# Patient Record
Sex: Female | Born: 1985 | Race: White | Hispanic: No | Marital: Married | Smoking: Never smoker
Health system: Southern US, Community
[De-identification: ages and names within clinical notes are randomized; demographics above are authoritative.]

## PROBLEM LIST (undated history)

## (undated) DIAGNOSIS — IMO0001 Reserved for inherently not codable concepts without codable children: Secondary | ICD-10-CM

## (undated) DIAGNOSIS — D649 Anemia, unspecified: Secondary | ICD-10-CM

## (undated) DIAGNOSIS — Z975 Presence of (intrauterine) contraceptive device: Secondary | ICD-10-CM

## (undated) HISTORY — PX: NO PAST SURGERIES: SHX2092

## (undated) HISTORY — DX: Reserved for inherently not codable concepts without codable children: IMO0001

---

## 2008-02-12 ENCOUNTER — Ambulatory Visit (HOSPITAL_COMMUNITY): Admission: RE | Admit: 2008-02-12 | Discharge: 2008-02-12 | Payer: Self-pay | Admitting: Obstetrics

## 2008-06-30 ENCOUNTER — Inpatient Hospital Stay (HOSPITAL_COMMUNITY): Admission: AD | Admit: 2008-06-30 | Discharge: 2008-06-30 | Payer: Self-pay | Admitting: Obstetrics & Gynecology

## 2008-07-01 ENCOUNTER — Inpatient Hospital Stay (HOSPITAL_COMMUNITY): Admission: AD | Admit: 2008-07-01 | Discharge: 2008-07-04 | Payer: Self-pay | Admitting: Obstetrics

## 2009-11-28 IMAGING — US US OB DETAIL+14 WK
1 series · 14 of 28 positions shown · non-contrast
Comparison: none

OBSTETRICAL ULTRASOUND:
 This ultrasound exam was performed in the [HOSPITAL] Ultrasound Department.  The OB US report was generated in the AS system, and faxed to the ordering physician.  This report is also available in [REDACTED] PACS.

[Series 1: us ob detail +14 wk · 14 of 95 slices shown]
[im 4/95]
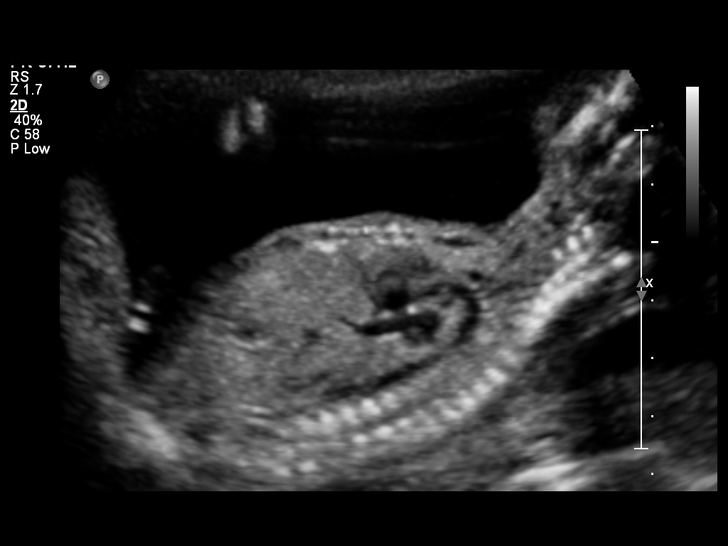
[im 11/95]
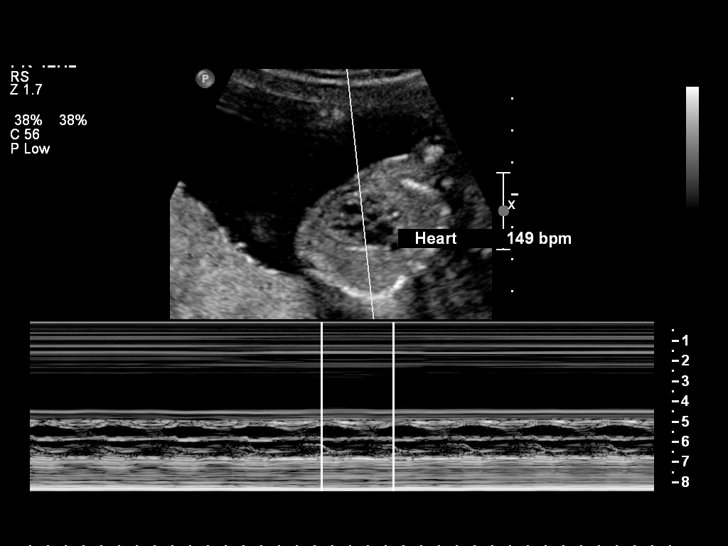
[im 18/95]
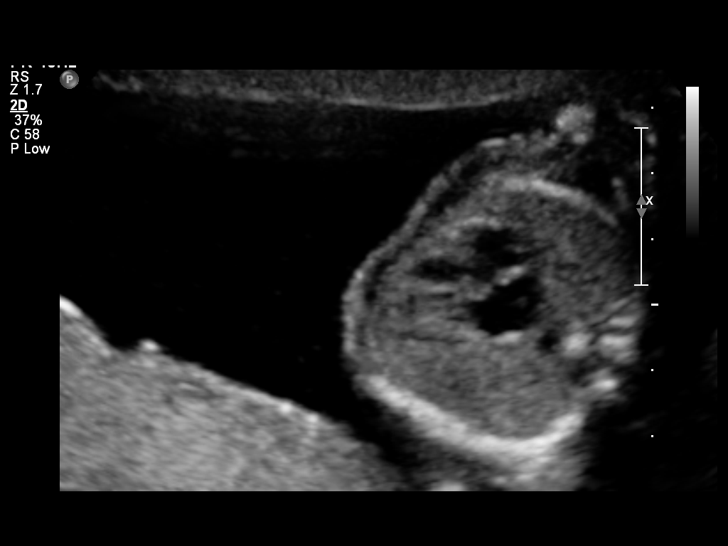
[im 25/95]
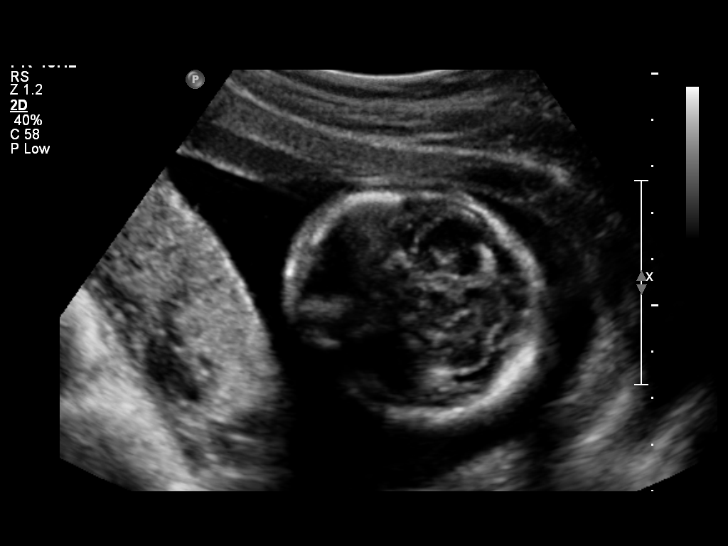
[im 32/95]
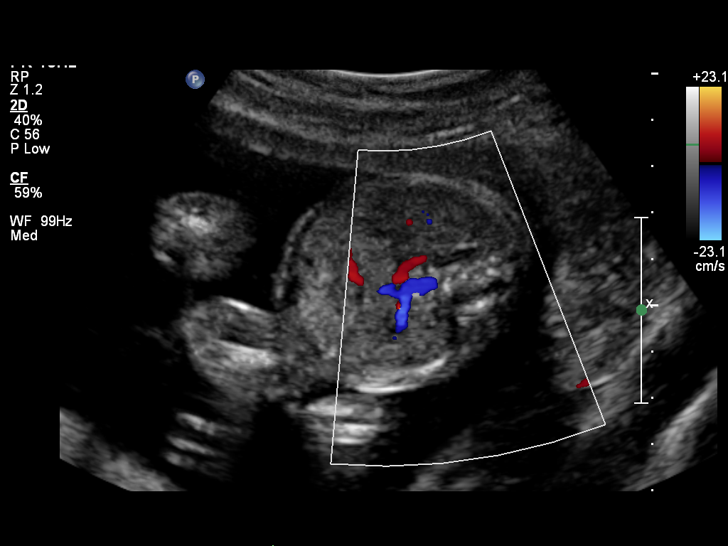
[im 39/95]
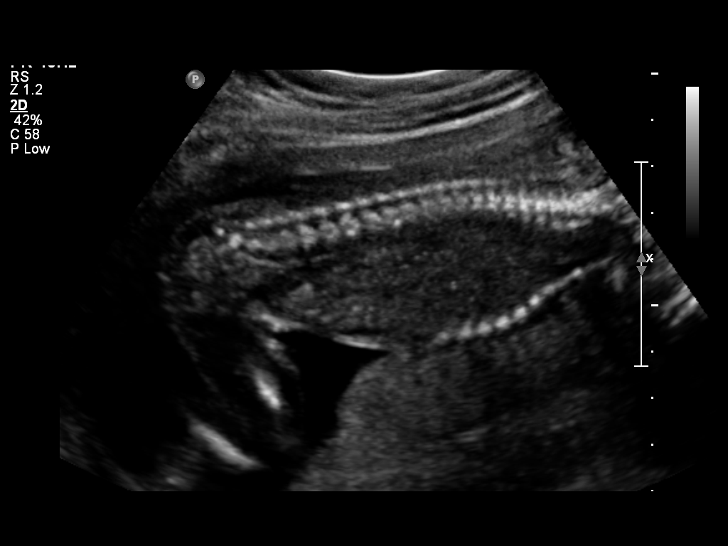
[im 46/95]
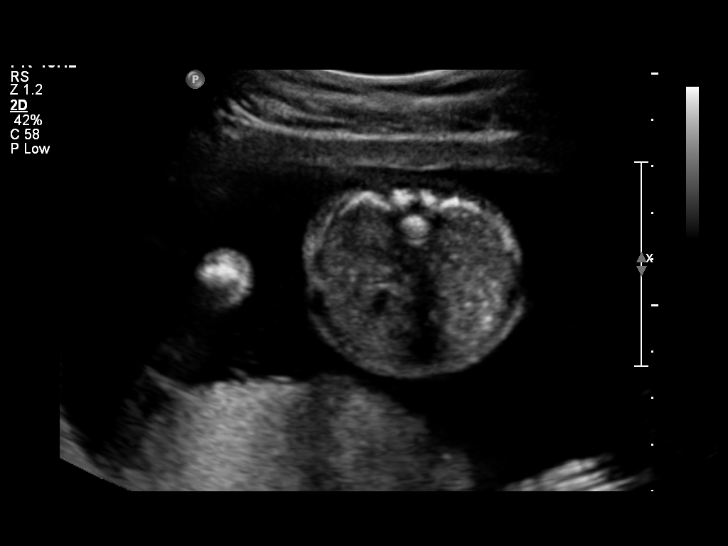
[im 53/95]
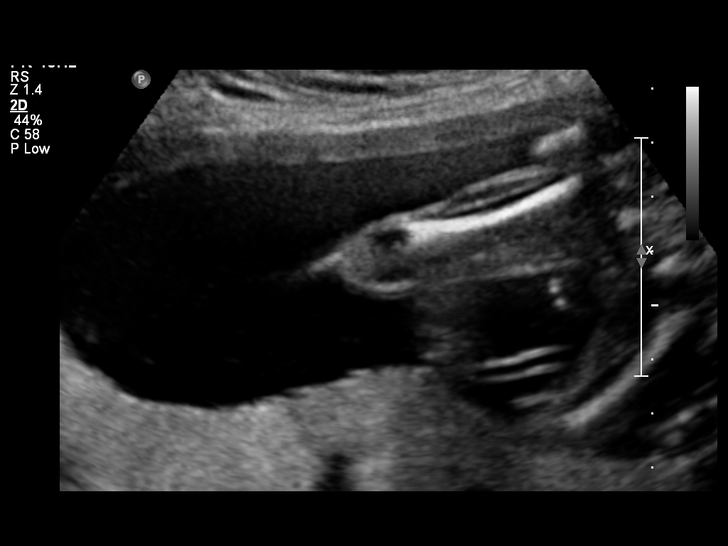
[im 60/95]
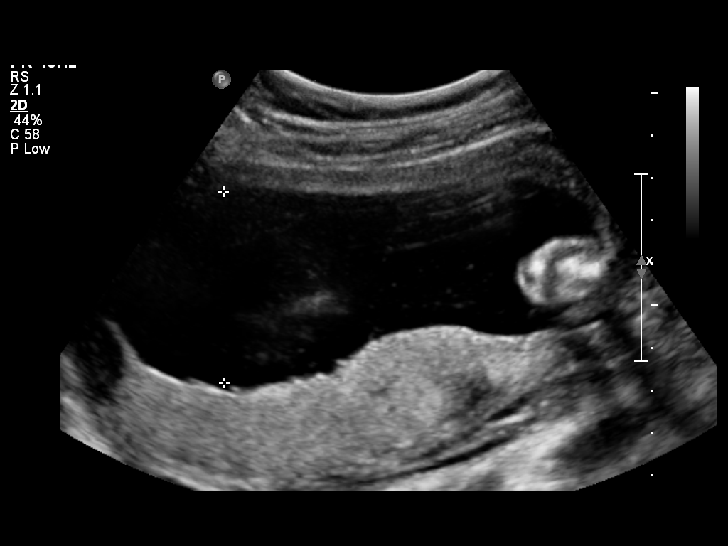
[im 67/95]
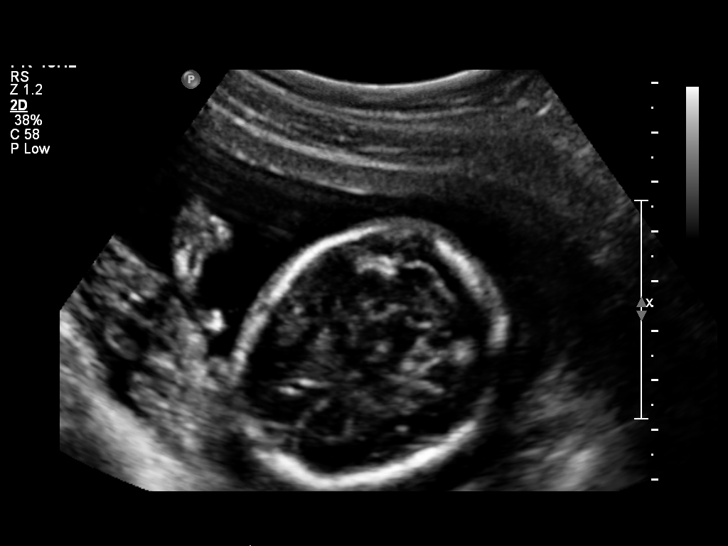
[im 74/95]
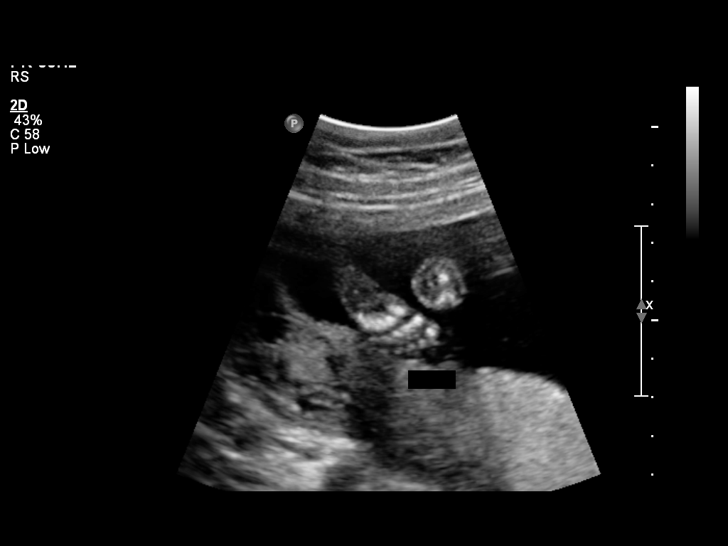
[im 81/95]
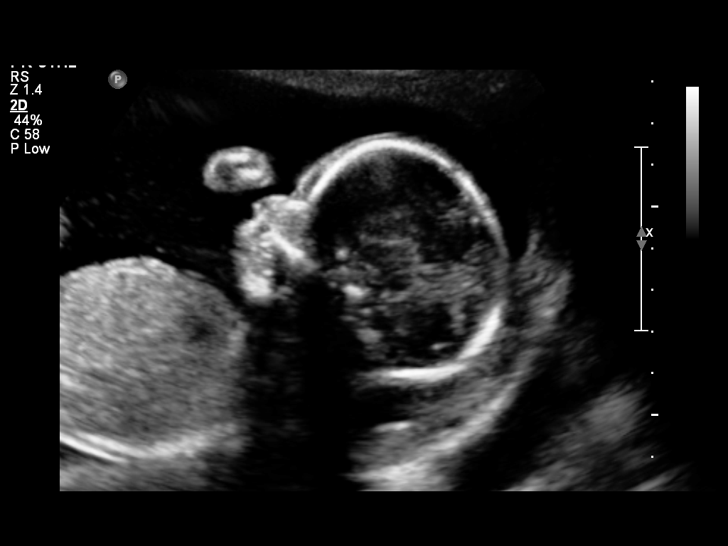
[im 88/95]
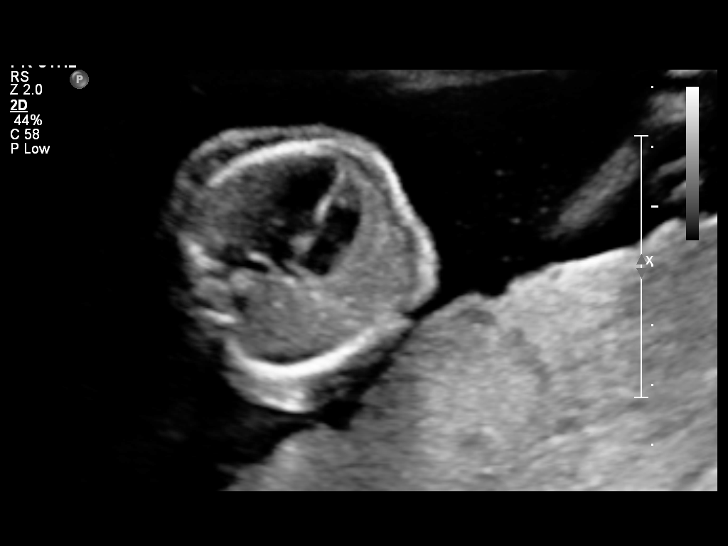
[im 95/95]
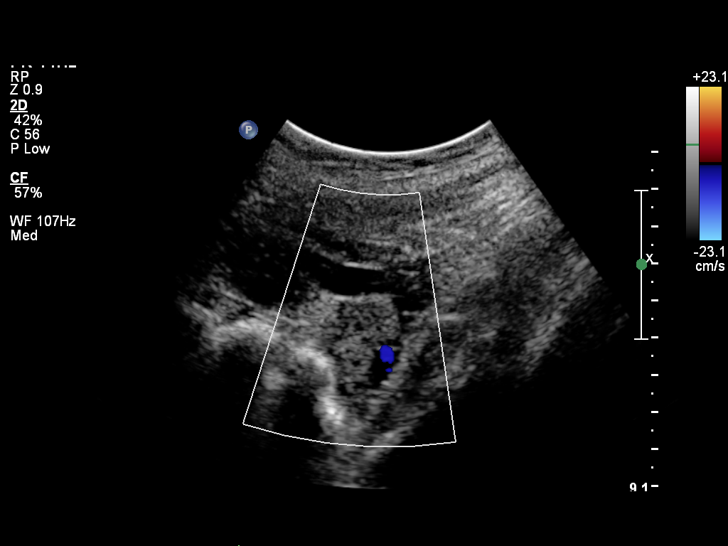

[14 of 28 positions shown; findings below may reference images not displayed]

IMPRESSION: See AS Obstetric US report.

## 2012-11-04 NOTE — L&D Delivery Note (Signed)
  Teresa Castillo, Teresa Castillo [[id number]]  Delivery Note At 5:09 PM a viable and healthy female was delivered via Vaginal, Spontaneous Delivery (Presentation: ; Occiput Anterior).  APGAR: 8, 9; weight 6 lb 1.7 oz (2770 g).   Placenta status: Intact, Spontaneous.  Cord: 3 vessels   The patient quickly progressed in labor and was found to be 9 cm.  She was quickly transported to the OR for delivery.  The Pt was without an epidural and wanted to try to get one before delivery.  In the OR she was sat up and was able to sit still long enough to get an epidural.  After placement she was examined and found to be complete.  She delivered baby A in the vertex OA presentation followed by the body.  The infant was passed to the waiting maternal abdomen.  The cord was clamped and cut and 2 cord clamps were placed on the umbilical cord.    Anesthesia: Epidural  Episiotomy: None Lacerations: None Suture Repair: NA Est. Blood Loss (mL): 500  Mom to postpartum.  Baby to nursery-stable.  Silvia Hightower Rexene Edison 04/01/2013, 9:42 PM     Teresa Castillo, Teresa Castillo [[id number]]  Delivery Note At 5:14 PM a viable and healthy female was delivered via Vaginal, Vacuum (Extractor) (Presentation: ; Occiput Anterior).  APGAR: 8, 9; weight 5 lb 12.6 oz (2625 g).   Placenta status: Intact, Spontaneous.  Cord: 3 vessels  After delivery of baby A, the presenting part of baby B was palpated and confirmed to be vertex.  Amniotomy was performed.  After amniotomy, the fetal heart rate decelerated to 70.  The patient pushed and there was minimal descent of the infants head. The fetal heart tones continued to be in the 70s so the decision was made to assist delivery of baby B with the kiwi vacuum due to persistent bradycardia. The vacuum was applied and the infants head was guided to crowning with one push. Following delivery of the head the vacuum was disengaged and the infants body was delivered and passed to the maternal abdomen.  Baby B cried  vigorously.  The cord was clamped and cut.  Cord blood was collected from the placenta of baby A and B and both placentas were then delivered spontaneously, intact, with 3 vessel cords.  No lacerations required repair.  Mom and babies were doing well after delivery  Anesthesia: Epidural  Episiotomy: None Lacerations: None Suture Repair: NA Est. Blood Loss (mL): 500  Mom to postpartum.  Baby to nursery-stable.  Calie Buttrey H. 04/01/2013, 9:42 PM

## 2013-01-22 LAB — OB RESULTS CONSOLE ABO/RH

## 2013-01-22 LAB — OB RESULTS CONSOLE RUBELLA ANTIBODY, IGM: Rubella: IMMUNE

## 2013-01-22 LAB — OB RESULTS CONSOLE ANTIBODY SCREEN: Antibody Screen: NEGATIVE

## 2013-03-22 LAB — OB RESULTS CONSOLE GBS: GBS: POSITIVE

## 2013-03-22 LAB — OB RESULTS CONSOLE GC/CHLAMYDIA
Chlamydia: NEGATIVE
Gonorrhea: NEGATIVE

## 2013-03-30 ENCOUNTER — Telehealth (HOSPITAL_COMMUNITY): Payer: Self-pay | Admitting: *Deleted

## 2013-03-30 ENCOUNTER — Encounter (HOSPITAL_COMMUNITY): Payer: Self-pay | Admitting: *Deleted

## 2013-03-30 NOTE — Telephone Encounter (Signed)
Preadmission screen  

## 2013-04-01 ENCOUNTER — Encounter (HOSPITAL_COMMUNITY): Payer: Self-pay | Admitting: Anesthesiology

## 2013-04-01 ENCOUNTER — Encounter (HOSPITAL_COMMUNITY): Payer: Self-pay

## 2013-04-01 ENCOUNTER — Inpatient Hospital Stay (HOSPITAL_COMMUNITY): Payer: BC Managed Care – PPO | Admitting: Anesthesiology

## 2013-04-01 ENCOUNTER — Encounter (HOSPITAL_COMMUNITY)
Admission: RE | Disposition: A | Discharge status: Home / Self Care | Payer: Self-pay | Source: Ambulatory Visit | Attending: Obstetrics and Gynecology

## 2013-04-01 ENCOUNTER — Inpatient Hospital Stay (HOSPITAL_COMMUNITY)
Admission: RE | Admit: 2013-04-01 | Discharge: 2013-04-03 | DRG: 373 | Disposition: A | Discharge status: Home / Self Care | Payer: BC Managed Care – PPO | Source: Ambulatory Visit | Attending: Obstetrics and Gynecology | Admitting: Obstetrics and Gynecology

## 2013-04-01 VITALS — BP 131/87 | HR 65 | Temp 98.4°F | Resp 18 | Ht 64.0 in | Wt 188.0 lb

## 2013-04-01 DIAGNOSIS — Z22330 Carrier of Group B streptococcus: Secondary | ICD-10-CM

## 2013-04-01 DIAGNOSIS — O9902 Anemia complicating childbirth: Secondary | ICD-10-CM | POA: Diagnosis present

## 2013-04-01 DIAGNOSIS — Z3480 Encounter for supervision of other normal pregnancy, unspecified trimester: Secondary | ICD-10-CM

## 2013-04-01 DIAGNOSIS — O99892 Other specified diseases and conditions complicating childbirth: Secondary | ICD-10-CM | POA: Diagnosis present

## 2013-04-01 DIAGNOSIS — O30009 Twin pregnancy, unspecified number of placenta and unspecified number of amniotic sacs, unspecified trimester: Principal | ICD-10-CM | POA: Diagnosis present

## 2013-04-01 DIAGNOSIS — D649 Anemia, unspecified: Secondary | ICD-10-CM | POA: Diagnosis present

## 2013-04-01 HISTORY — DX: Anemia, unspecified: D64.9

## 2013-04-01 LAB — CBC
HCT: 27.3 % — ABNORMAL LOW (ref 36.0–46.0)
MCV: 78.9 fL (ref 78.0–100.0)
MCV: 77.9 fL — ABNORMAL LOW (ref 78.0–100.0)
Platelets: 120 10*3/uL — ABNORMAL LOW (ref 150–400)
RBC: 3.53 MIL/uL — ABNORMAL LOW (ref 3.87–5.11)
RDW: 15.3 % (ref 11.5–15.5)
WBC: 5.3 10*3/uL (ref 4.0–10.5)
WBC: 6.1 10*3/uL (ref 4.0–10.5)

## 2013-04-01 LAB — TYPE AND SCREEN

## 2013-04-01 SURGERY — Surgical Case
Anesthesia: Regional

## 2013-04-01 MED ORDER — LACTATED RINGERS IV SOLN: INTRAVENOUS | Status: DC

## 2013-04-01 MED ORDER — DIBUCAINE 1 % RE OINT: 1.0000 "application " | TOPICAL_OINTMENT | RECTAL | Status: DC | PRN

## 2013-04-01 MED ORDER — BUPIVACAINE HCL (PF) 0.25 % IJ SOLN
INTRAMUSCULAR | Status: DC | PRN
  Administered 2013-04-01: 3 mL

## 2013-04-01 MED ORDER — ACETAMINOPHEN 325 MG PO TABS: 650.0000 mg | ORAL_TABLET | ORAL | Status: DC | PRN

## 2013-04-01 MED ORDER — PHENYLEPHRINE 40 MCG/ML (10ML) SYRINGE FOR IV PUSH (FOR BLOOD PRESSURE SUPPORT)
80.0000 ug | PREFILLED_SYRINGE | INTRAVENOUS | Status: DC | PRN
  Filled 2013-04-01: qty 5

## 2013-04-01 MED ORDER — OXYCODONE-ACETAMINOPHEN 5-325 MG PO TABS
1.0000 | ORAL_TABLET | ORAL | Status: DC | PRN
  Administered 2013-04-01 – 2013-04-03 (×4): 1 via ORAL
  Filled 2013-04-01 (×4): qty 1

## 2013-04-01 MED ORDER — OXYCODONE-ACETAMINOPHEN 5-325 MG PO TABS: 1.0000 | ORAL_TABLET | ORAL | Status: DC | PRN

## 2013-04-01 MED ORDER — IBUPROFEN 600 MG PO TABS
600.0000 mg | ORAL_TABLET | Freq: Four times a day (QID) | ORAL | Status: DC
  Administered 2013-04-01 – 2013-04-03 (×8): 600 mg via ORAL
  Filled 2013-04-01 (×8): qty 1

## 2013-04-01 MED ORDER — BENZOCAINE-MENTHOL 20-0.5 % EX AERO: 1.0000 "application " | INHALATION_SPRAY | CUTANEOUS | Status: DC | PRN

## 2013-04-01 MED ORDER — EPHEDRINE 5 MG/ML INJ: 10.0000 mg | INTRAVENOUS | Status: DC | PRN

## 2013-04-01 MED ORDER — LACTATED RINGERS IV SOLN
500.0000 mL | INTRAVENOUS | Status: DC | PRN
  Administered 2013-04-01: 1000 mL via INTRAVENOUS

## 2013-04-01 MED ORDER — BUTORPHANOL TARTRATE 1 MG/ML IJ SOLN: 1.0000 mg | INTRAMUSCULAR | Status: DC | PRN

## 2013-04-01 MED ORDER — OXYTOCIN 40 UNITS IN LACTATED RINGERS INFUSION - SIMPLE MED: 1.0000 m[IU]/min | INTRAVENOUS | Status: DC

## 2013-04-01 MED ORDER — SIMETHICONE 80 MG PO CHEW: 80.0000 mg | CHEWABLE_TABLET | ORAL | Status: DC | PRN

## 2013-04-01 MED ORDER — ZOLPIDEM TARTRATE 5 MG PO TABS: 5.0000 mg | ORAL_TABLET | Freq: Every evening | ORAL | Status: DC | PRN

## 2013-04-01 MED ORDER — OXYTOCIN BOLUS FROM INFUSION: 500.0000 mL | INTRAVENOUS | Status: DC

## 2013-04-01 MED ORDER — ONDANSETRON HCL 4 MG PO TABS: 4.0000 mg | ORAL_TABLET | ORAL | Status: DC | PRN

## 2013-04-01 MED ORDER — IBUPROFEN 600 MG PO TABS: 600.0000 mg | ORAL_TABLET | Freq: Four times a day (QID) | ORAL | Status: DC | PRN

## 2013-04-01 MED ORDER — OXYTOCIN 40 UNITS IN LACTATED RINGERS INFUSION - SIMPLE MED
62.5000 mL/h | INTRAVENOUS | Status: DC
  Administered 2013-04-01: 62.5 mL/h via INTRAVENOUS
  Filled 2013-04-01: qty 1000

## 2013-04-01 MED ORDER — EPHEDRINE 5 MG/ML INJ
10.0000 mg | INTRAVENOUS | Status: DC | PRN
  Filled 2013-04-01: qty 4

## 2013-04-01 MED ORDER — LACTATED RINGERS IV SOLN: 500.0000 mL | Freq: Once | INTRAVENOUS | Status: DC

## 2013-04-01 MED ORDER — SENNOSIDES-DOCUSATE SODIUM 8.6-50 MG PO TABS
2.0000 | ORAL_TABLET | Freq: Every day | ORAL | Status: DC
  Administered 2013-04-01 – 2013-04-02 (×2): 2 via ORAL

## 2013-04-01 MED ORDER — LANOLIN HYDROUS EX OINT: TOPICAL_OINTMENT | CUTANEOUS | Status: DC | PRN

## 2013-04-01 MED ORDER — PRENATAL MULTIVITAMIN CH
1.0000 | ORAL_TABLET | Freq: Every day | ORAL | Status: DC
  Administered 2013-04-02 – 2013-04-03 (×2): 1 via ORAL
  Filled 2013-04-01 (×2): qty 1

## 2013-04-01 MED ORDER — CITRIC ACID-SODIUM CITRATE 334-500 MG/5ML PO SOLN: 30.0000 mL | ORAL | Status: DC | PRN

## 2013-04-01 MED ORDER — TERBUTALINE SULFATE 1 MG/ML IJ SOLN: 0.2500 mg | Freq: Once | INTRAMUSCULAR | Status: DC | PRN

## 2013-04-01 MED ORDER — METHYLERGONOVINE MALEATE 0.2 MG PO TABS: 0.2000 mg | ORAL_TABLET | ORAL | Status: DC | PRN

## 2013-04-01 MED ORDER — FERROUS SULFATE 325 (65 FE) MG PO TABS
325.0000 mg | ORAL_TABLET | Freq: Two times a day (BID) | ORAL | Status: DC
  Administered 2013-04-02 – 2013-04-03 (×3): 325 mg via ORAL
  Filled 2013-04-01 (×3): qty 1

## 2013-04-01 MED ORDER — TETANUS-DIPHTH-ACELL PERTUSSIS 5-2.5-18.5 LF-MCG/0.5 IM SUSP
0.5000 mL | Freq: Once | INTRAMUSCULAR | Status: AC
  Administered 2013-04-02: 0.5 mL via INTRAMUSCULAR
  Filled 2013-04-01: qty 0.5

## 2013-04-01 MED ORDER — PHENYLEPHRINE 40 MCG/ML (10ML) SYRINGE FOR IV PUSH (FOR BLOOD PRESSURE SUPPORT): 80.0000 ug | PREFILLED_SYRINGE | INTRAVENOUS | Status: DC | PRN

## 2013-04-01 MED ORDER — ONDANSETRON HCL 4 MG/2ML IJ SOLN: 4.0000 mg | Freq: Four times a day (QID) | INTRAMUSCULAR | Status: DC | PRN

## 2013-04-01 MED ORDER — PENICILLIN G POTASSIUM 5000000 UNITS IJ SOLR
2.5000 10*6.[IU] | INTRAVENOUS | Status: DC
  Filled 2013-04-01 (×3): qty 2.5

## 2013-04-01 MED ORDER — ONDANSETRON HCL 4 MG/2ML IJ SOLN: 4.0000 mg | INTRAMUSCULAR | Status: DC | PRN

## 2013-04-01 MED ORDER — LIDOCAINE HCL (PF) 1 % IJ SOLN
30.0000 mL | INTRAMUSCULAR | Status: DC | PRN
  Filled 2013-04-01: qty 30

## 2013-04-01 MED ORDER — PENICILLIN G POTASSIUM 5000000 UNITS IJ SOLR
5.0000 10*6.[IU] | Freq: Once | INTRAVENOUS | Status: AC
  Administered 2013-04-01: 5 10*6.[IU] via INTRAVENOUS
  Filled 2013-04-01: qty 5

## 2013-04-01 MED ORDER — DIPHENHYDRAMINE HCL 50 MG/ML IJ SOLN: 12.5000 mg | INTRAMUSCULAR | Status: DC | PRN

## 2013-04-01 MED ORDER — METHYLERGONOVINE MALEATE 0.2 MG/ML IJ SOLN: 0.2000 mg | INTRAMUSCULAR | Status: DC | PRN

## 2013-04-01 MED ORDER — FENTANYL 2.5 MCG/ML BUPIVACAINE 1/10 % EPIDURAL INFUSION (WH - ANES)
14.0000 mL/h | INTRAMUSCULAR | Status: DC | PRN
  Filled 2013-04-01: qty 125

## 2013-04-01 MED ORDER — DIPHENHYDRAMINE HCL 25 MG PO CAPS: 25.0000 mg | ORAL_CAPSULE | Freq: Four times a day (QID) | ORAL | Status: DC | PRN

## 2013-04-01 MED ORDER — WITCH HAZEL-GLYCERIN EX PADS: 1.0000 "application " | MEDICATED_PAD | CUTANEOUS | Status: DC | PRN

## 2013-04-01 SURGICAL SUPPLY — 27 items
CLAMP CORD UMBIL (MISCELLANEOUS) IMPLANT
CLOTH BEACON ORANGE TIMEOUT ST (SAFETY) ×2 IMPLANT
DERMABOND ADVANCED (GAUZE/BANDAGES/DRESSINGS)
DERMABOND ADVANCED .7 DNX12 (GAUZE/BANDAGES/DRESSINGS) IMPLANT
DRAPE LG THREE QUARTER DISP (DRAPES) ×2 IMPLANT
DRSG OPSITE POSTOP 4X10 (GAUZE/BANDAGES/DRESSINGS) ×2 IMPLANT
DURAPREP 26ML APPLICATOR (WOUND CARE) ×2 IMPLANT
ELECT REM PT RETURN 9FT ADLT (ELECTROSURGICAL) ×2
ELECTRODE REM PT RTRN 9FT ADLT (ELECTROSURGICAL) ×1 IMPLANT
EXTRACTOR VACUUM M CUP 4 TUBE (SUCTIONS) IMPLANT
GLOVE BIO SURGEON STRL SZ7 (GLOVE) ×2 IMPLANT
GOWN STRL REIN XL XLG (GOWN DISPOSABLE) ×4 IMPLANT
KIT ABG SYR 3ML LUER SLIP (SYRINGE) IMPLANT
NEEDLE HYPO 25X5/8 SAFETYGLIDE (NEEDLE) IMPLANT
NS IRRIG 1000ML POUR BTL (IV SOLUTION) ×2 IMPLANT
PACK C SECTION WH (CUSTOM PROCEDURE TRAY) ×2 IMPLANT
PAD OB MATERNITY 4.3X12.25 (PERSONAL CARE ITEMS) ×2 IMPLANT
RTRCTR C-SECT PINK 25CM LRG (MISCELLANEOUS) ×2 IMPLANT
STAPLER VISISTAT 35W (STAPLE) IMPLANT
SUT CHROMIC 1 CTX 36 (SUTURE) ×4 IMPLANT
SUT PDS AB 0 CTX 60 (SUTURE) ×2 IMPLANT
SUT VIC AB 2-0 CT1 27 (SUTURE) ×1
SUT VIC AB 2-0 CT1 TAPERPNT 27 (SUTURE) ×1 IMPLANT
SUT VIC AB 4-0 KS 27 (SUTURE) IMPLANT
TOWEL OR 17X24 6PK STRL BLUE (TOWEL DISPOSABLE) ×6 IMPLANT
TRAY FOLEY CATH 14FR (SET/KITS/TRAYS/PACK) ×2 IMPLANT
WATER STERILE IRR 1000ML POUR (IV SOLUTION) ×2 IMPLANT

## 2013-04-01 NOTE — Anesthesia Preprocedure Evaluation (Addendum)

## 2013-04-01 NOTE — H&P (Signed)
Teresa Castillo is a 27 y.o. female presenting for IOL  27 yo [id number] @ 38 weeks presents for an induction of labor for diamniotic/dichorionic twin gestation.  Her pregnancy has been uncomplicated to this point except for anemia.  Her twins have shown concordant growth throughout the pregnancy. History OB History   Grav Para Term Preterm Abortions TAB SAB Ect Mult Living   4 2 2  0 2 2 0 0 1 3     Past Medical History  Diagnosis Date  . Twins   . Anemia     with current pregnancy; was to be taking Iron supp.  Marland Kitchen GERD (gastroesophageal reflux disease)     with current pregnancy; Tums helped   Past Surgical History  Procedure Laterality Date  . No past surgeries     Family History: family history includes COPD in her maternal grandfather; Diabetes in her father and paternal grandfather; Hearing loss in her mother; Hypertension in her maternal grandmother; Learning disabilities in her mother; and Miscarriages / Stillbirths in her mother.  There is no history of Alcohol abuse, and Arthritis, and Asthma, and Birth defects, and Cancer, and Depression, and Drug abuse, and Early death, and Heart disease, and Hyperlipidemia, and Kidney disease, and Mental illness, and Mental retardation, and Vision loss, and Stroke, . Social History:  reports that she has never smoked. She has never used smokeless tobacco. She reports that she does not drink alcohol or use illicit drugs.   Prenatal Transfer Tool  Maternal Diabetes: No Genetic Screening: Declined Maternal Ultrasounds/Referrals: Normal Fetal Ultrasounds or other Referrals:  None Maternal Substance Abuse:  No Significant Maternal Medications:  None Significant Maternal Lab Results:  Lab values include: Other:  Hgb 8.3 Other Comments:  None  ROS : as above   Dilation: 10 Effacement (%): 100 Station: +2 Exam by:: Dr. Tenny Craw Blood pressure 127/80, pulse 101, temperature 98.3 F (36.8 C), temperature source Oral, resp. rate 18, height 5\' 4"   (1.626 m), weight 85.276 kg (188 lb), last menstrual period 07/09/2012, SpO2 100.00%, unknown if currently breastfeeding. Exam Physical Exam  Prenatal labs: ABO, Rh: --/--/O POS, O POS (05/29 0825) Antibody: NEG (05/29 0825) Rubella: Immune (03/21 0000) RPR: NON REACTIVE (05/29 0825)  HBsAg: Negative (03/21 0000)  HIV: Non-reactive (03/21 0000)  GBS: Positive (05/19 0000)   Assessment/Plan: 1) Admit 2) PCN for GBS 3) Epidural on request 4) AROM pit   Teresa Castillo H. 04/01/2013, 10:00 PM

## 2013-04-01 NOTE — Anesthesia Procedure Notes (Signed)
Epidural Patient location during procedure: OB  Preanesthetic Checklist Completed: patient identified, site marked, surgical consent, pre-op evaluation, timeout performed, IV checked, risks and benefits discussed and monitors and equipment checked  Epidural Patient position: sitting Prep: site prepped and draped and DuraPrep Patient monitoring: continuous pulse ox and blood pressure Approach: midline Injection technique: LOR air  Needle:  Needle type: Tuohy  Needle gauge: 17 G Needle length: 9 cm and 9 Needle insertion depth: 5 cm cm Catheter type: closed end flexible Catheter size: 19 Gauge Catheter at skin depth: 10 cm Test dose: negative  Assessment Events: blood not aspirated, injection not painful, no injection resistance, negative IV test and no paresthesia  Additional Notes Spinal Dosage in OR (-) asp CSF  Bupivicaine 3 ml .25% through sprotte 24GA   Pt comfortable

## 2013-04-02 ENCOUNTER — Encounter (HOSPITAL_COMMUNITY): Payer: Self-pay | Admitting: Obstetrics and Gynecology

## 2013-04-02 LAB — CBC
MCHC: 31 g/dL (ref 30.0–36.0)
RDW: 15.4 % (ref 11.5–15.5)
WBC: 8.9 10*3/uL (ref 4.0–10.5)

## 2013-04-02 NOTE — Progress Notes (Signed)
PPD#1 Pt is doing well. Lochia One baby with hypospadius. IMP/ Doing well PLAN/ routine care.

## 2013-04-03 MED ORDER — FERROUS SULFATE 325 (65 FE) MG PO TABS: 325.0000 mg | ORAL_TABLET | Freq: Two times a day (BID) | ORAL | Status: DC

## 2013-04-03 MED ORDER — IBUPROFEN 600 MG PO TABS: 600.0000 mg | ORAL_TABLET | Freq: Four times a day (QID) | ORAL | Status: DC

## 2013-04-03 MED ORDER — OXYCODONE-ACETAMINOPHEN 5-325 MG PO TABS
1.0000 | ORAL_TABLET | ORAL | Status: DC | PRN
Start: 2013-04-03 — End: 2013-07-04

## 2013-04-03 NOTE — Progress Notes (Signed)
PPD#2 Pt doing well. Will discharge to home.

## 2013-04-03 NOTE — Discharge Summary (Signed)
Obstetric Discharge Summary Reason for Admission: induction of labor and twin pregnancy Prenatal Procedures: ultrasound Intrapartum Procedures: spontaneous vaginal delivery and vacuum Postpartum Procedures: none Complications-Operative and Postpartum: none Hemoglobin  Date Value Range Status  04/02/2013 7.2* 12.0 - 15.0 g/dL Final     HCT  Date Value Range Status  04/02/2013 23.2* 36.0 - 46.0 % Final    Physical Exam:  General: alert Lochia: appropriate Uterine Fundus: firm   Discharge Diagnoses: Term Pregnancy-delivered and twin pregnancy  Discharge Information: Date: 04/03/2013 Activity: pelvic rest Diet: routine Medications: PNV, Ibuprofen, Iron and Percocet Condition: stable Instructions: refer to practice specific booklet Discharge to: home Follow-up Information   Follow up with Almon Hercules., MD. Schedule an appointment as soon as possible for a visit in 4 weeks.   Contact information:   [address] [place] [address] [place] [address] [phone]       Newborn Data:   Jacqualyn, Sedgwick [[id number]]  Live born female  Birth Weight: 6 lb 1.7 oz (2770 g) APGAR: 8, 9   Grizel, Vesely [[id number]]  Live born female  Birth Weight: 5 lb 12.6 oz (2625 g) APGAR: 8, 9  Home with mother.  ANDERSON,MARK E 04/03/2013, 7:41 AM

## 2013-04-15 ENCOUNTER — Inpatient Hospital Stay (HOSPITAL_COMMUNITY): Admission: AD | Admit: 2013-04-15 | Payer: Self-pay | Source: Ambulatory Visit | Admitting: Obstetrics and Gynecology

## 2013-06-04 DIAGNOSIS — Z975 Presence of (intrauterine) contraceptive device: Secondary | ICD-10-CM

## 2013-06-04 HISTORY — DX: Presence of (intrauterine) contraceptive device: Z97.5

## 2013-07-04 ENCOUNTER — Ambulatory Visit (INDEPENDENT_AMBULATORY_CARE_PROVIDER_SITE_OTHER): Payer: BC Managed Care – PPO | Admitting: Family Medicine

## 2013-07-04 VITALS — BP 120/80 | HR 86 | Temp 99.5°F | Resp 16 | Ht 64.5 in | Wt 144.0 lb

## 2013-07-04 DIAGNOSIS — J029 Acute pharyngitis, unspecified: Secondary | ICD-10-CM

## 2013-07-04 DIAGNOSIS — J020 Streptococcal pharyngitis: Secondary | ICD-10-CM

## 2013-07-04 LAB — POCT RAPID STREP A (OFFICE): Rapid Strep A Screen: POSITIVE — AB

## 2013-07-04 MED ORDER — AMOXICILLIN 875 MG PO TABS: 875.0000 mg | ORAL_TABLET | Freq: Two times a day (BID) | ORAL | Status: DC

## 2013-07-04 NOTE — Progress Notes (Signed)
[facility]:  Office Visit  Chief Complaint:  Chief Complaint  Patient presents with  . Sore Throat    x 2 day    HPI: Teresa Castillo is a 27 y.o. female who complains of 2 day history of sore thorat, she has a 26 year old in kindergarten and 52 month old tiwns. She denies fevers or chills. + PND, no allergies. No ear or facial pain  She is breast feeding. She has never had strep throat. + "stuff in back of throat"  Past Medical History  Diagnosis Date  . Twins   . Anemia     with current pregnancy; was to be taking Iron supp.  Marland Kitchen GERD (gastroesophageal reflux disease)     with current pregnancy; Tums helped   Past Surgical History  Procedure Laterality Date  . No past surgeries    . Cesarean section N/A 04/01/2013    Procedure: Vaginal delivery of Twins;  Surgeon: Freddrick March. Tenny Craw, MD;  Location: [facility];  Service: Obstetrics;  Laterality: N/A;   History   Social History  . Marital Status: Married    Spouse Name: N/A    Number of Children: N/A  . Years of Education: N/A   Social History Main Topics  . Smoking status: Never Smoker   . Smokeless tobacco: Never Used  . Alcohol Use: No  . Drug Use: No  . Sexual Activity: Yes     Comment: TUBAL IF cs   Other Topics Concern  . None   Social History Narrative  . None   Family History  Problem Relation Age of Onset  . Hearing loss Mother     left ear deaf  . Learning disabilities Mother     ADHD  . Miscarriages / [place] Mother   . Diabetes Father   . Hypertension Maternal Grandmother   . COPD Maternal Grandfather   . Diabetes Paternal Grandfather   . Alcohol abuse Neg Hx   . Arthritis Neg Hx   . Asthma Neg Hx   . Birth defects Neg Hx   . Cancer Neg Hx   . Depression Neg Hx   . Drug abuse Neg Hx   . Early death Neg Hx   . Heart disease Neg Hx   . Hyperlipidemia Neg Hx   . Kidney disease Neg Hx   . Mental illness Neg Hx   . Mental retardation Neg Hx   . Vision loss Neg Hx   . Stroke  Neg Hx    Allergies  Allergen Reactions  . Sulfa Antibiotics     Unknown reaction, childhood   Prior to Admission medications   Medication Sig Start Date End Date Taking? Authorizing Provider  ibuprofen (ADVIL,MOTRIN) 600 MG tablet Take 1 tablet (600 mg total) by mouth every 6 (six) hours. 04/03/13  Yes Levi Aland, MD  ferrous sulfate 325 (65 FE) MG tablet Take 1 tablet (325 mg total) by mouth 2 (two) times daily with a meal. 04/03/13   Levi Aland, MD  oxyCODONE-acetaminophen (PERCOCET/ROXICET) 5-325 MG per tablet Take 1-2 tablets by mouth every 4 (four) hours as needed. 04/03/13   Levi Aland, MD  Prenatal Vit-Fe Fumarate-FA (PRENATAL MULTIVITAMIN) TABS Take 1 tablet by mouth daily at 12 noon.    Historical Provider, MD     ROS: The patient denies fevers, chills, night sweats, unintentional weight loss, chest pain, palpitations, wheezing, dyspnea on exertion, nausea, vomiting, abdominal pain, dysuria, hematuria, melena, numbness, weakness, or  tingling.   All other systems have been reviewed and were otherwise negative with the exception of those mentioned in the HPI and as above.    PHYSICAL EXAM: Filed Vitals:   07/04/13 1545  BP: 120/80  Pulse: 86  Temp: 99.5 F (37.5 C)  Resp: 16   Filed Vitals:   07/04/13 1545  Height: 5' 4.5" (1.638 m)  Weight: 144 lb (65.318 kg)   Body mass index is 24.34 kg/(m^2).  General: Alert, no acute distress HEENT:  Normocephalic, atraumatic, oropharynx patent. EOMI, PERRLA, + exudates. Tm nl. Tonsil erythematous but nl  Cardiovascular:  Regular rate and rhythm, no rubs murmurs or gallops.  No Carotid bruits, radial pulse intact. No pedal edema.  Respiratory: Clear to auscultation bilaterally.  No wheezes, rales, or rhonchi.  No cyanosis, no use of accessory musculature GI: No organomegaly, abdomen is soft and non-tender, positive bowel sounds.  No masses. Skin: No rashes. Neurologic: Facial musculature symmetric. Psychiatric:  Patient is appropriate throughout our interaction. Lymphatic: No cervical lymphadenopathy Musculoskeletal: Gait intact.   LABS: Results for orders placed in visit on 07/04/13  POCT RAPID STREP A (OFFICE)      Result Value Range   Rapid Strep A Screen Positive (*) Negative     EKG/XRAY:   Primary read interpreted by Dr. Conley Rolls at [facility].   ASSESSMENT/PLAN: Encounter Diagnoses  Name Primary?  . Sore throat Yes  . Strep pharyngitis    Rx amoxacillin 875 mg Work note given for 3 days, return to work Wednesday night Advise to let pediatrician know since she has young children Gross sideeffects, risk and benefits, and alternatives of medications d/w patient. Patient is aware that all medications have potential sideeffects and we are unable to predict every sideeffect or drug-drug interaction that may occur.  Hoby Kawai PHUONG, DO 07/04/2013 4:10 PM

## 2013-07-04 NOTE — Patient Instructions (Addendum)

## 2013-11-04 HISTORY — PX: LEEP: SHX91

## 2013-12-19 ENCOUNTER — Inpatient Hospital Stay (HOSPITAL_COMMUNITY): Payer: BC Managed Care – PPO

## 2013-12-19 ENCOUNTER — Inpatient Hospital Stay (HOSPITAL_COMMUNITY)
Admission: AD | Admit: 2013-12-19 | Discharge: 2013-12-19 | Disposition: A | Discharge status: Home / Self Care | Payer: BC Managed Care – PPO | Source: Ambulatory Visit | Attending: Obstetrics and Gynecology | Admitting: Obstetrics and Gynecology

## 2013-12-19 ENCOUNTER — Encounter (HOSPITAL_COMMUNITY): Payer: Self-pay

## 2013-12-19 DIAGNOSIS — N939 Abnormal uterine and vaginal bleeding, unspecified: Secondary | ICD-10-CM

## 2013-12-19 DIAGNOSIS — N898 Other specified noninflammatory disorders of vagina: Secondary | ICD-10-CM | POA: Insufficient documentation

## 2013-12-19 DIAGNOSIS — Z30431 Encounter for routine checking of intrauterine contraceptive device: Secondary | ICD-10-CM | POA: Insufficient documentation

## 2013-12-19 HISTORY — DX: Presence of (intrauterine) contraceptive device: Z97.5

## 2013-12-19 LAB — URINE MICROSCOPIC-ADD ON

## 2013-12-19 LAB — URINALYSIS, ROUTINE W REFLEX MICROSCOPIC
BILIRUBIN URINE: NEGATIVE
GLUCOSE, UA: NEGATIVE mg/dL
Ketones, ur: 15 mg/dL — AB
Leukocytes, UA: NEGATIVE
Nitrite: NEGATIVE
PH: 6.5 (ref 5.0–8.0)
Protein, ur: NEGATIVE mg/dL
SPECIFIC GRAVITY, URINE: 1.02 (ref 1.005–1.030)
Urobilinogen, UA: 0.2 mg/dL (ref 0.0–1.0)

## 2013-12-19 LAB — POCT PREGNANCY, URINE: PREG TEST UR: NEGATIVE

## 2013-12-19 MED ORDER — ESTRADIOL 0.5 MG PO TABS: 0.5000 mg | ORAL_TABLET | Freq: Every day | ORAL | Status: DC

## 2013-12-19 MED ORDER — FERRIC SUBSULFATE SOLN
Freq: Once | Status: DC
  Filled 2013-12-19: qty 100

## 2013-12-19 NOTE — MAU Note (Signed)
Had LEEP procedure 3 weeks ago, was spotting then yesterday at 6:30 pm, bleeding increased and was using super tampon, passed a large blood clot at 4:30 am, came here and tampon was full from just one hour.

## 2013-12-19 NOTE — MAU Provider Note (Signed)
History     CSN: [id number]  Arrival date and time: 12/19/13 4098   None     Chief Complaint  Patient presents with  . Vaginal Bleeding   Vaginal Bleeding Pertinent negatives include no abdominal pain, fever, nausea or vomiting.   This is a 28 y.o. female who presents with c/o bleeding since yesterday. It increased in the eventing necessitating a super tampon.  She passed a large clot this morning.  Denies pain. Has had IUD since August, and has had spotting off and on since then.   RN note: Had LEEP procedure 3 weeks ago, was spotting then yesterday at 6:30 pm, bleeding increased and was using super tampon, passed a large blood clot at 4:30 am, came here and tampon was full from just one hour.       OB History   Grav Para Term Preterm Abortions TAB SAB Ect Mult Living   4 2 2  0 2 2 0 0 1 3      Past Medical History  Diagnosis Date  . Twins   . Anemia     with current pregnancy; was to be taking Iron supp.  . IUD (intrauterine device) in place 06/2013    Mirena    Past Surgical History  Procedure Laterality Date  . No past surgeries    . Cesarean section N/A 04/01/2013    Procedure: Vaginal delivery of Twins;  Surgeon: Freddrick March. Tenny Craw, MD;  Location: [facility];  Service: Obstetrics;  Laterality: N/A;  . Leep  11/2013    Family History  Problem Relation Age of Onset  . Hearing loss Mother     left ear deaf  . Learning disabilities Mother     ADHD  . Miscarriages / [place] Mother   . Diabetes Father   . Hypertension Maternal Grandmother   . COPD Maternal Grandfather   . Diabetes Paternal Grandfather   . Alcohol abuse Neg Hx   . Arthritis Neg Hx   . Asthma Neg Hx   . Birth defects Neg Hx   . Cancer Neg Hx   . Depression Neg Hx   . Drug abuse Neg Hx   . Early death Neg Hx   . Heart disease Neg Hx   . Hyperlipidemia Neg Hx   . Kidney disease Neg Hx   . Mental illness Neg Hx   . Mental retardation Neg Hx   . Vision loss Neg Hx   . Stroke Neg Hx      History  Substance Use Topics  . Smoking status: Never Smoker   . Smokeless tobacco: Never Used  . Alcohol Use: Yes     Comment: occas use    Allergies:  Allergies  Allergen Reactions  . Sulfa Antibiotics     Unknown reaction, childhood    Prescriptions prior to admission  Medication Sig Dispense Refill  . amoxicillin (AMOXIL) 875 MG tablet Take 1 tablet (875 mg total) by mouth 2 (two) times daily.  20 tablet  0  . ibuprofen (ADVIL,MOTRIN) 600 MG tablet Take 1 tablet (600 mg total) by mouth every 6 (six) hours.  30 tablet  0  . Prenatal Vit-Fe Fumarate-FA (PRENATAL MULTIVITAMIN) TABS Take 1 tablet by mouth daily at 12 noon.        Review of Systems  Constitutional: Negative for fever and malaise/fatigue.  Gastrointestinal: Negative for nausea, vomiting and abdominal pain.  Genitourinary: Positive for vaginal bleeding.       Vaginal bleeding  Neurological: Negative for  weakness.   Physical Exam   Blood pressure 103/63, pulse 76, temperature 98.4 F (36.9 C), temperature source Oral, resp. rate 18.  Physical Exam  Constitutional: She is oriented to person, place, and time. She appears well-developed and well-nourished. No distress.  HENT:  Head: Normocephalic.  Cardiovascular: Normal rate.   Respiratory: Effort normal.  GI: Soft. She exhibits no distension. There is no tenderness. There is no rebound and no guarding.  Genitourinary: Uterus normal. Vaginal discharge (quarter sized clot at os, removed with swab, no active bleeding. Cervix slightly friable) found.  Musculoskeletal: Normal range of motion.  Neurological: She is alert and oriented to person, place, and time.  Skin: Skin is warm and dry.  Psychiatric: She has a normal mood and affect.    MAU Course  Procedures  MDM Discussed with Dr Henderson CloudHorvath.  Will check US to measure endometrial thickness. If thickened will treat with estradiol.  Will also apply Monsels to cervix to alleviate friability  US showed  IUD in proper place Endometrium 10mm Ovaries normal  Monsels applied to cervix  Assessment and Plan  A:  Vaginal bleeding probably due to cervical friability  P:  DIscussed with Dr Henderson CloudHorvath       Discharge home       May use ibuprofen PRN      Followup in office                       [facility] Francis[facility]ILLIAMS,Cartha Rotert 12/19/2013, 6:19 AM

## 2013-12-19 NOTE — Discharge Instructions (Signed)

## 2014-09-05 ENCOUNTER — Encounter (HOSPITAL_COMMUNITY): Payer: Self-pay

## 2015-10-05 IMAGING — US US PELVIS COMPLETE
1 series · 14 of 25 positions shown · non-contrast
Comparison: None

CLINICAL DATA: IUD with bleeding.  Evaluate endometrial thickness.

EXAM:
TRANSABDOMINAL AND TRANSVAGINAL ULTRASOUND OF PELVIS
TECHNIQUE: Both transabdominal and transvaginal ultrasound examinations of the
pelvis were performed. Transabdominal technique was performed for
global imaging of the pelvis including uterus, ovaries, adnexal
regions, and pelvic cul-de-sac. It was necessary to proceed with
endovaginal exam following the transabdominal exam to visualize the
endometrium and ovaries.

[Series 1: us pelvis complete · 44 acquisitions, 14 frames shown]
[im 1/44]
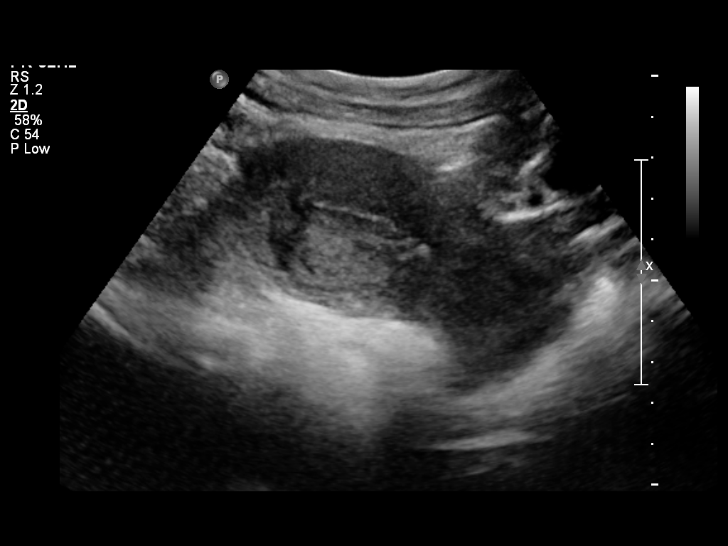
[im 4/44]
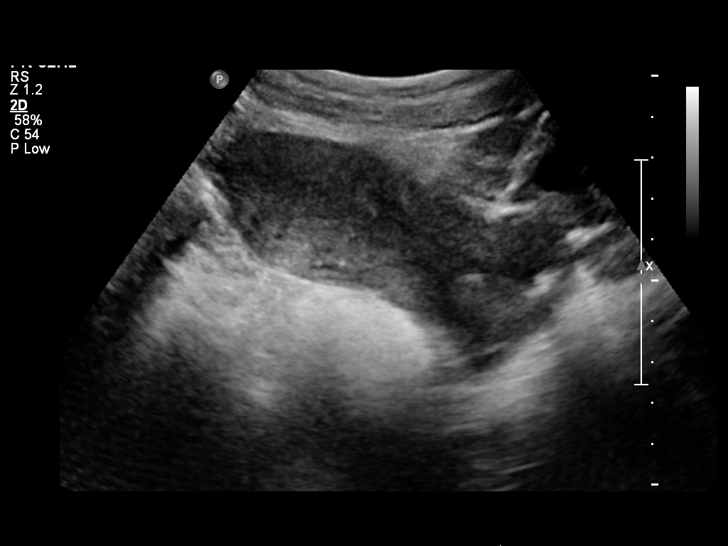
[im 8/44]
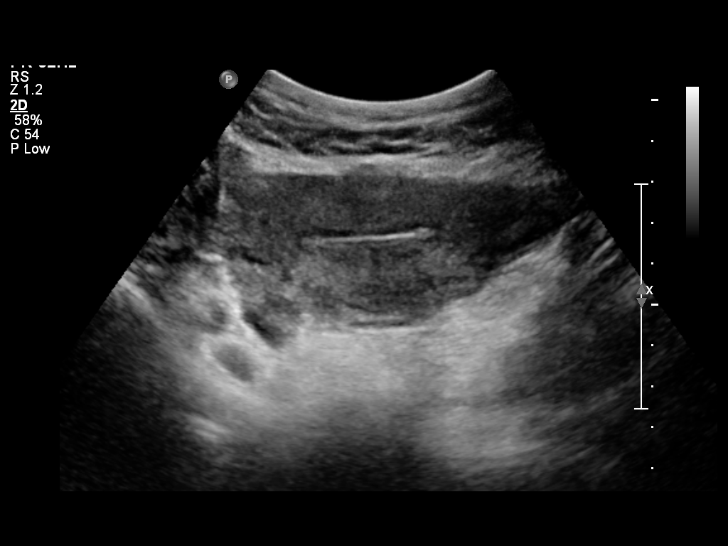
[im 11/44]
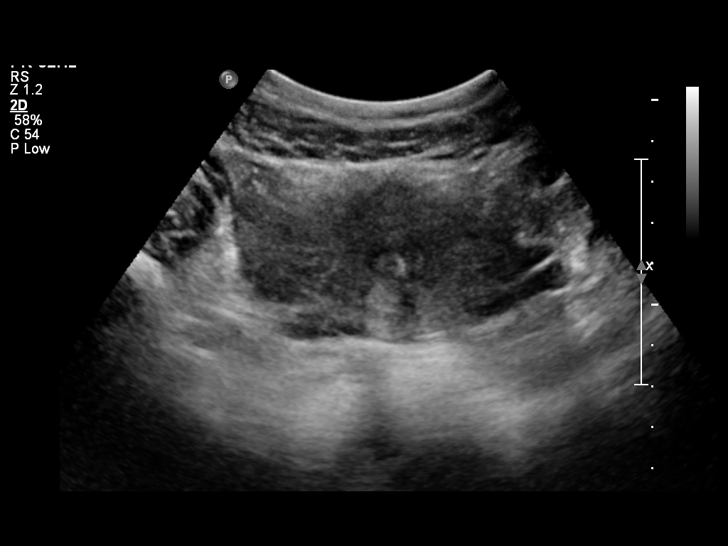
[im 15/44]
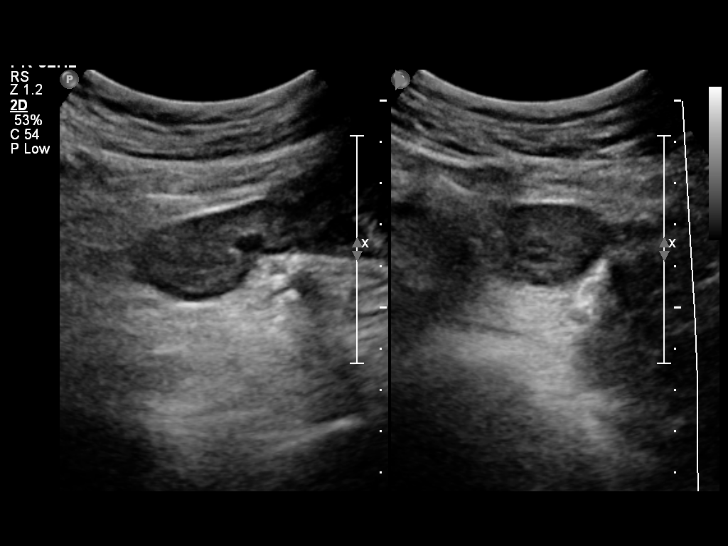
[im 17/44]
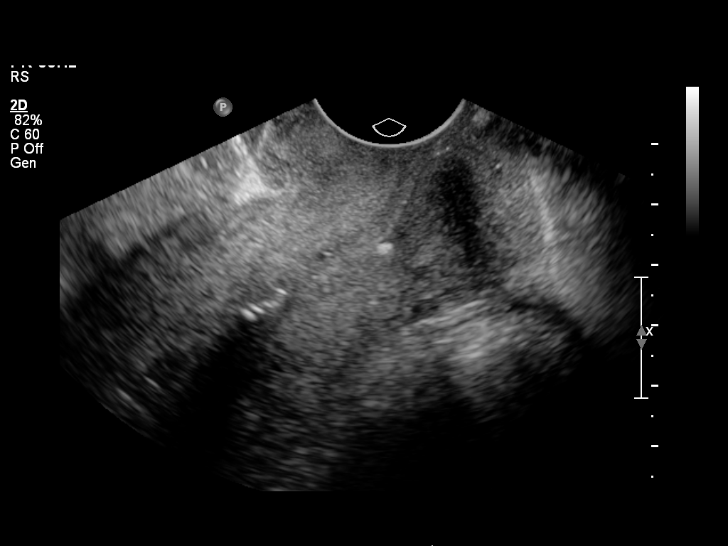
[im 20/44]
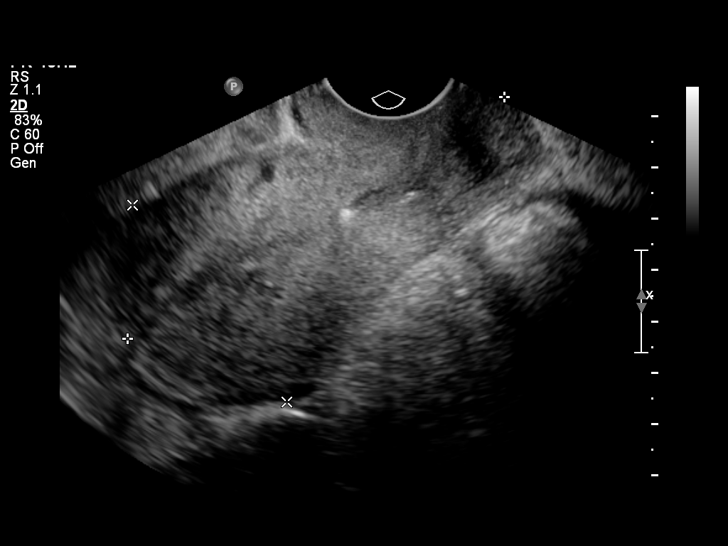
[im 24/44]
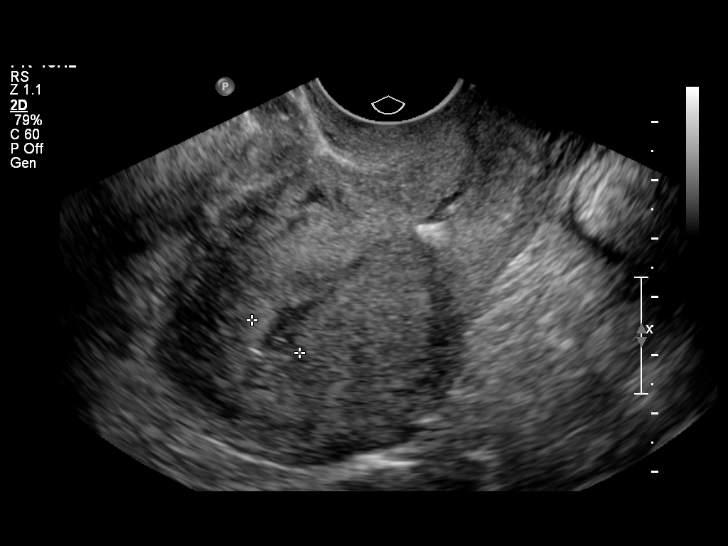
[im 27/44]
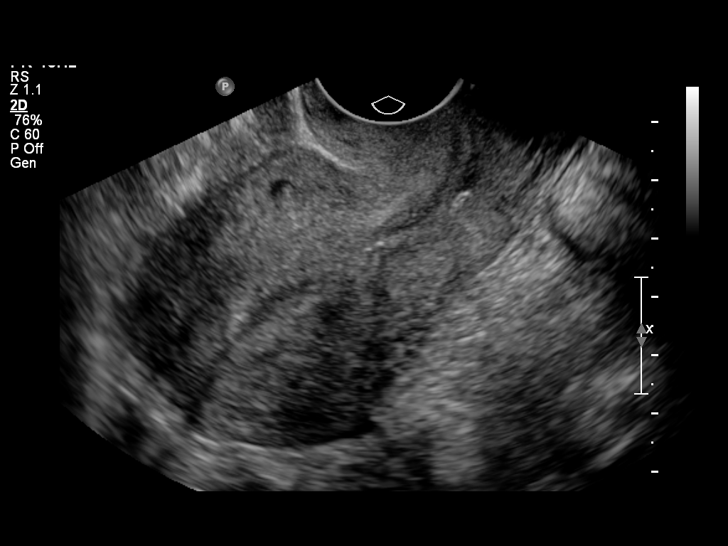
[im 29/44]
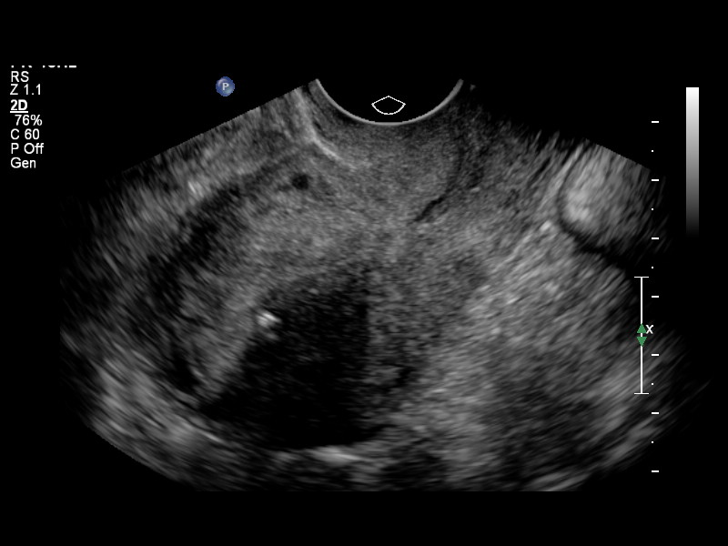
[im 33/44]
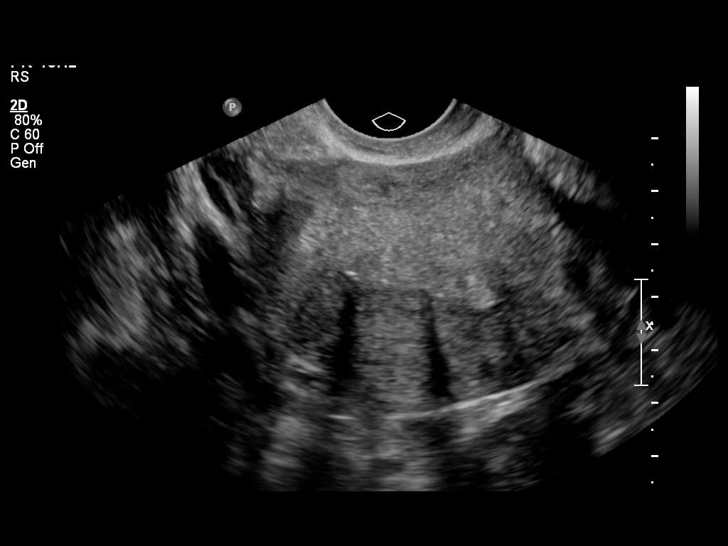
[im 36/44]
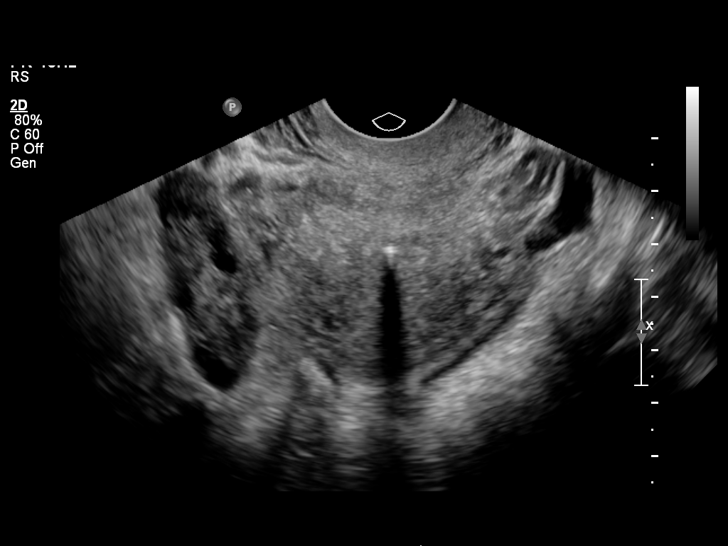
[im 40/44]
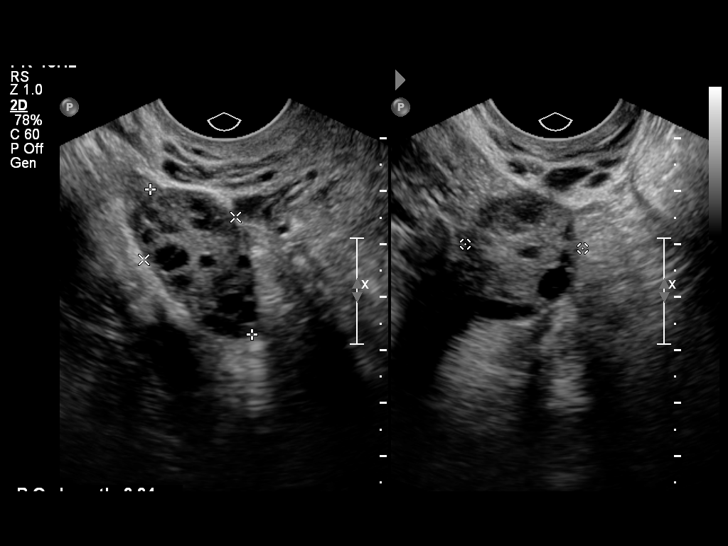
[im 44/44]
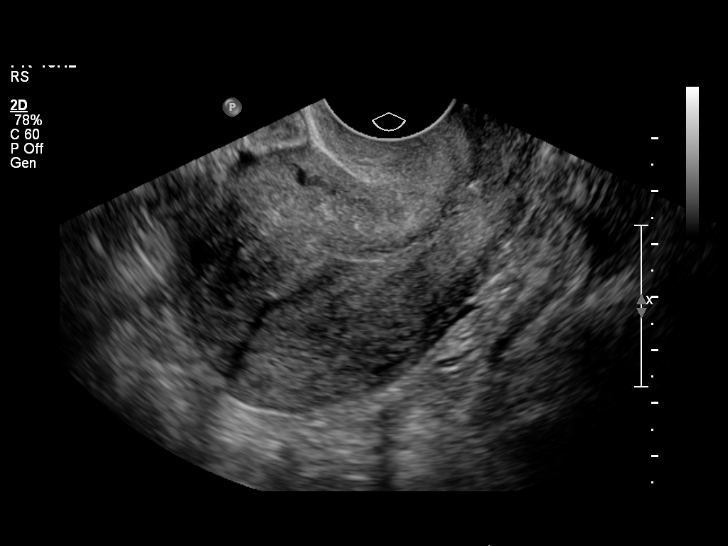

[14 of 25 positions shown; findings below may reference images not displayed]

FINDINGS: Uterus

Measurements: 4.9 x 6.3 x 8.7 cm. No fibroids or other mass
visualized.

Endometrium

Thickness: 10 mm. IUD in adequate position. Small amount of mobile
hypoechoic debris is noted likely minimal hemorrhage in this patient
with bleeding.

Right ovary

Measurements: 1.9 x 2.2 x 3.3 cm. Normal appearance/no adnexal mass.
Normal vascularity.

Left ovary

Measurements: 2.5 x 2.2 x 4.2 cm. Normal appearance/no adnexal mass.
Normal vascularity.

Other findings

No free fluid.
IMPRESSION: IUD in adequate position. Minimal hemorrhagic debris within the
endometrial canal as the endometrium is otherwise normal in
thickness.

Normal ovaries.
